# Patient Record
Sex: Female | Born: 1970 | Race: White | Hispanic: No | Marital: Married | Smoking: Never smoker
Health system: Southern US, Community
[De-identification: ages and names within clinical notes are randomized; demographics above are authoritative.]

## PROBLEM LIST (undated history)

## (undated) DIAGNOSIS — I10 Essential (primary) hypertension: Secondary | ICD-10-CM

## (undated) HISTORY — PX: ABDOMINAL HYSTERECTOMY: SHX81

---

## 2004-12-19 ENCOUNTER — Ambulatory Visit: Payer: Self-pay | Admitting: Gastroenterology

## 2004-12-20 ENCOUNTER — Ambulatory Visit: Payer: Self-pay | Admitting: Surgery

## 2005-01-17 ENCOUNTER — Ambulatory Visit: Payer: Self-pay | Admitting: Family Medicine

## 2005-03-01 ENCOUNTER — Inpatient Hospital Stay: Payer: Self-pay | Admitting: Vascular Surgery

## 2005-09-14 ENCOUNTER — Ambulatory Visit: Payer: Self-pay | Admitting: Family Medicine

## 2005-10-16 ENCOUNTER — Ambulatory Visit: Payer: Self-pay | Admitting: Family Medicine

## 2005-12-17 ENCOUNTER — Ambulatory Visit: Payer: Self-pay | Admitting: Family Medicine

## 2006-04-08 ENCOUNTER — Ambulatory Visit: Payer: Self-pay | Admitting: Family Medicine

## 2006-08-15 ENCOUNTER — Emergency Department: Payer: Self-pay | Admitting: Emergency Medicine

## 2007-01-29 ENCOUNTER — Ambulatory Visit: Payer: Self-pay | Admitting: Family Medicine

## 2007-07-16 ENCOUNTER — Encounter (INDEPENDENT_AMBULATORY_CARE_PROVIDER_SITE_OTHER): Payer: Self-pay | Admitting: Internal Medicine

## 2007-07-17 ENCOUNTER — Ambulatory Visit: Payer: Self-pay | Admitting: Family Medicine

## 2007-07-17 DIAGNOSIS — R1031 Right lower quadrant pain: Secondary | ICD-10-CM | POA: Insufficient documentation

## 2008-01-09 ENCOUNTER — Ambulatory Visit: Payer: Self-pay | Admitting: Family Medicine

## 2008-01-09 DIAGNOSIS — R209 Unspecified disturbances of skin sensation: Secondary | ICD-10-CM | POA: Insufficient documentation

## 2008-01-09 DIAGNOSIS — G500 Trigeminal neuralgia: Secondary | ICD-10-CM | POA: Insufficient documentation

## 2008-01-15 ENCOUNTER — Telehealth (INDEPENDENT_AMBULATORY_CARE_PROVIDER_SITE_OTHER): Payer: Self-pay | Admitting: Internal Medicine

## 2008-01-18 ENCOUNTER — Ambulatory Visit: Payer: Self-pay | Admitting: Internal Medicine

## 2008-01-18 ENCOUNTER — Inpatient Hospital Stay (HOSPITAL_COMMUNITY): Admission: EM | Admit: 2008-01-18 | Discharge: 2008-01-19 | Payer: Self-pay | Admitting: Emergency Medicine

## 2008-01-19 ENCOUNTER — Encounter: Payer: Self-pay | Admitting: Family Medicine

## 2008-01-19 ENCOUNTER — Ambulatory Visit: Payer: Self-pay | Admitting: *Deleted

## 2008-01-19 ENCOUNTER — Encounter (INDEPENDENT_AMBULATORY_CARE_PROVIDER_SITE_OTHER): Payer: Self-pay | Admitting: Internal Medicine

## 2008-01-19 ENCOUNTER — Encounter: Payer: Self-pay | Admitting: Internal Medicine

## 2008-02-03 ENCOUNTER — Ambulatory Visit: Payer: Self-pay | Admitting: Family Medicine

## 2008-02-03 DIAGNOSIS — E786 Lipoprotein deficiency: Secondary | ICD-10-CM | POA: Insufficient documentation

## 2008-02-03 DIAGNOSIS — G459 Transient cerebral ischemic attack, unspecified: Secondary | ICD-10-CM | POA: Insufficient documentation

## 2008-02-10 ENCOUNTER — Ambulatory Visit: Payer: Self-pay | Admitting: Family Medicine

## 2008-02-10 DIAGNOSIS — D6489 Other specified anemias: Secondary | ICD-10-CM | POA: Insufficient documentation

## 2008-02-23 LAB — CONVERTED CEMR LAB
Eosinophils Relative: 1.7 % (ref 0.0–5.0)
HCT: 30.9 % — ABNORMAL LOW (ref 36.0–46.0)
Neutrophils Relative %: 48.1 % (ref 43.0–77.0)
Platelets: 243 10*3/uL (ref 150–400)
RBC: 4.58 M/uL (ref 3.87–5.11)
RDW: 17 % — ABNORMAL HIGH (ref 11.5–14.6)
Saturation Ratios: 11.8 % — ABNORMAL LOW (ref 20.0–50.0)
Transferrin: 358.4 mg/dL (ref >=212.0)
WBC: 6.2 10*3/uL (ref 4.5–10.5)

## 2008-03-01 ENCOUNTER — Encounter (INDEPENDENT_AMBULATORY_CARE_PROVIDER_SITE_OTHER): Payer: Self-pay | Admitting: *Deleted

## 2008-03-05 ENCOUNTER — Telehealth: Payer: Self-pay | Admitting: Family Medicine

## 2008-03-08 ENCOUNTER — Ambulatory Visit: Payer: Self-pay | Admitting: Family Medicine

## 2008-04-01 ENCOUNTER — Encounter: Payer: Self-pay | Admitting: Family Medicine

## 2008-04-05 ENCOUNTER — Ambulatory Visit: Payer: Self-pay | Admitting: Family Medicine

## 2008-04-05 ENCOUNTER — Telehealth (INDEPENDENT_AMBULATORY_CARE_PROVIDER_SITE_OTHER): Payer: Self-pay | Admitting: Internal Medicine

## 2008-04-06 ENCOUNTER — Telehealth (INDEPENDENT_AMBULATORY_CARE_PROVIDER_SITE_OTHER): Payer: Self-pay | Admitting: Internal Medicine

## 2008-04-09 ENCOUNTER — Encounter (INDEPENDENT_AMBULATORY_CARE_PROVIDER_SITE_OTHER): Payer: Self-pay | Admitting: Internal Medicine

## 2008-04-09 LAB — CONVERTED CEMR LAB
AST: 21 units/L (ref 0–37)
Albumin: 3.5 g/dL (ref 3.5–5.2)
BUN: 4 mg/dL — ABNORMAL LOW (ref 6–23)
Basophils Absolute: 0 10*3/uL (ref 0.0–0.1)
Basophils Relative: 0.1 % (ref 0.0–1.0)
Calcium: 8.6 mg/dL (ref 8.4–10.5)
Chloride: 105 meq/L (ref 96–112)
Cholesterol: 120 mg/dL (ref 0–200)
Creatinine, Ser: 0.8 mg/dL (ref 0.4–1.2)
Eosinophils Absolute: 0.1 10*3/uL (ref 0.0–0.7)
Eosinophils Relative: 1.6 % (ref 0.0–5.0)
Ferritin: 1.7 ng/mL — ABNORMAL LOW (ref 10.0–291.0)
Folate: 6.1 ng/mL
GFR calc Af Amer: 103 mL/min
GFR calc non Af Amer: 85 mL/min
HCT: 25.5 % — ABNORMAL LOW (ref 36.0–46.0)
HDL: 23.8 mg/dL — ABNORMAL LOW (ref >39.0)
Iron: 13 ug/dL — ABNORMAL LOW (ref 42–145)
MCHC: 30.8 g/dL (ref 30.0–36.0)
MCV: 69.4 fL — ABNORMAL LOW (ref 78.0–100.0)
Monocytes Absolute: 0.7 10*3/uL (ref 0.1–1.0)
Neutrophils Relative %: 55 % (ref 43.0–77.0)
RBC: 3.67 M/uL — ABNORMAL LOW (ref 3.87–5.11)
TSH: 2.17 microintl units/mL (ref 0.35–5.50)
Total Bilirubin: 0.4 mg/dL (ref 0.3–1.2)
Transferrin: 334.3 mg/dL (ref >=212.0)
VLDL: 24 mg/dL (ref 0–40)

## 2010-01-16 ENCOUNTER — Ambulatory Visit: Payer: Self-pay | Admitting: Unknown Physician Specialty

## 2010-01-27 ENCOUNTER — Ambulatory Visit: Payer: Self-pay | Admitting: Unknown Physician Specialty

## 2010-02-16 ENCOUNTER — Ambulatory Visit: Payer: Self-pay | Admitting: Unknown Physician Specialty

## 2010-03-16 ENCOUNTER — Ambulatory Visit: Payer: Self-pay | Admitting: Unknown Physician Specialty

## 2010-07-27 ENCOUNTER — Ambulatory Visit: Payer: Self-pay | Admitting: Unknown Physician Specialty

## 2011-04-17 NOTE — Discharge Summary (Signed)
NAME:  Becky Harris, Becky Harris                  ACCOUNT NO.:  [id number]   MEDICAL RECORD NO.:  [id number]          PATIENT TYPE:  INP   LOCATION:  3039                         FACILITY:  [facility]   PHYSICIAN:  Valerie A. Felicity Coyer, MDDATE OF BIRTH:  1971/01/15   DATE OF ADMISSION:  01/18/2008  DATE OF DISCHARGE:  01/19/2008                               DISCHARGE SUMMARY   DISCHARGE DIAGNOSES:  1. Transient ischemic attack.  2. 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   HISTORY OF PRESENT ILLNESS:  1. Becky Harris is a 40 year old female who was admitted on January 18, 2008, with a chief complaint of numbness in her face, arms, and      legs.  She has no significant past medical history and had been in      good health prior to the past 1 week.  On January 09, 2008, the      patient noted an episode of numbness and tingling in her right face      and right forearm.  She denied any pain, weakness, or slurred      speech associated with this episode.  The episode lasted      approximately 12 hours.  2. Trigeminal nerve neuralgia.  Because her symptoms subsided, no      further workup was initiated.  She did have another episode where      she fell lightheaded on January 15, 2008.  She was admitted for      further evaluation and treatment.   PAST MEDICAL HISTORY:  None.   COURSE OF HOSPITALIZATION:  TIA:  The patient was admitted and underwent  CT of the head, which showed no acute changes.  This was followed by an  MRA, which showed no acute CVA.  A 2-D echo was performed during this  admission, which showed normal LV function.  She was noted to have 40%  to 60% right carotid stenosis and an aspirin was added during this  admission.  This will need outpatient monitoring.   MEDICATIONS AT THE TIME OF DISCHARGE:  Aspirin 325 mg p.o. daily.   FOLLOWUP:  The patient is to follow up with Dr. Sherryll Burger in approximately 2  weeks and contact the office for an appointment.   PERTINENT LABORATORIES AT THE  TIME OF DISCHARGE:  Hemoglobin 9.9,  hematocrit 31.2, BUN 3, creatinine 0.8, cholesterol 116, and LDL 72.     Sandford Craze, NP      Raenette Rover. Felicity Coyer, MD  Electronically Signed   MO/MEDQ  D:  02/19/2008  T:  02/20/2008  Job:  [id number]

## 2011-04-17 NOTE — H&P (Signed)
NAMEDORIANN, ZUCH NO.:  [id number]   MEDICAL RECORD NO.:  [id number]          PATIENT TYPE:  EMS   LOCATION:  MAJO                         FACILITY:  [facility]   PHYSICIAN:  Therisa Doyne, MD    DATE OF BIRTH:  [birth year]   DATE OF ADMISSION:  01/18/2008  DATE OF DISCHARGE:                              HISTORY & PHYSICAL   PRIMARY CARE Crosley Stejskal:  Arta Silence, M.D.   CHIEF COMPLAINT:  Numbness in the face, arms, and legs.   HISTORY OF PRESENT ILLNESS:  A 40 year old white female with no  significant past medical history who presents for evaluation of numbness  and tingling over the past one week.  The patient has been in good  health prior to the past one week.  On January 09, 2008, the patient  reports an episode of numbness and tingling in her right face and right  forearm.  She denies any pain, weakness or slurred speech associated  with this episode.  The episode lasted approximately 12 hours.  She was  seen and evaluated by her primary care physician who felt this was  possibly trigeminal neuralgia.  Because her symptoms subsided, no  further workup was initiated.  However, on January 15, 2008, she had  another episode where she felt lightheaded.  Also, her entire face felt  numb and tingling.  Additionally, she had associated lip and tongue  tingling, and her right forearm was again numb.  She denies any focal  weakness or slurred speech during this episode.  This lasted  approximately 3-4 hours and resolved.   Today, she represents with similar complaints but also has a sharp pain  on the right side of her head.  Additionally her right leg has  associated numbness.  She denies any focal weakness, slurred speech,  vision changes, bowel or bladder incontinence, or recent infections.  She has never had any symptoms like this before.   REVIEW OF SYSTEMS:  All systems reviewed and are negative except as  mentioned above in the history of present  illness.   PAST MEDICAL HISTORY:  None.   SOCIAL HISTORY:  The patient lives in [place].  She denies tobacco,  alcohol, or drugs.   FAMILY HISTORY:  There is a family history for cerebrovascular accident  in her father at the age of 23.   ALLERGIES:  No known drug allergies.   MEDICATIONS:  None.   PHYSICAL EXAMINATION:  VITAL SIGNS:  Temperature is 96.9, blood pressure  141/91, pulse 68, respirations 20, oxygen saturation 100% on room air.  GENERAL:  No acute distress.  HEENT:  Normocephalic atraumatic.  Pupils equal, round, reactive to  light and accommodation.  Extraocular movements are intact.  NECK:  Supple.  No lymphadenopathy.  No jugular venous distention.  No  masses.  CARDIOVASCULAR:  Regular rate and rhythm.  No murmurs, rubs, or gallops.  CHEST:  Clear to auscultation bilaterally.  ABDOMEN:  Soft, nontender, nondistended.  EXTREMITIES:  No clubbing, cyanosis, or edema.  NEUROLOGIC:  In general she is alert and oriented x3.  Cranial nerves II-  XII are grossly intact with no focal deficits.  Deep tendon reflexes are  2 plus bilaterally.  Muscle strength is 5/5 in the bilateral upper and  lower extremities.  Sensation is globally intact.  On cerebellar  function testing her rapid alternating movements are intact but she does  have mild dysdiadochokinesis of the left hand with finger-to-nose.  This  improved on her second try with increased concentration.   LABORATORY:  CBC and BMP within normal limits.   STUDIES:  1. CT of the head showed no acute intracranial abnormalities.  2. EKG showed a normal sinus rhythm at 52 beats per minute with no      acute ST-T wave changes.   ASSESSMENT:  A 40 year old white female with no significant past medical  history who presents for evaluation of multiple episodes of facial  numbness, right forearm tingling, and right leg numbness and tingling  for the past one week.   PLAN:  1. We will admit the patient to [facility].   1. Episodes of numbness and tingling.  These are worrisome for      transient ischemic attacks.  However, the differential also      includes multiple sclerosis versus trigeminal neuralgia versus      vasculitis.  Her only risk factor for stroke is a family history.      She denies hypertension, hyperlipidemia, and diabetes, although she      is hypertensive here.  As I am concerned about transient ischemic      attacks, we will start her on aspirin 325 mg daily.  We will      proceed with further workup with an MRI of the brain as well as an      MRA of the brain, carotid Dopplers, transcranial Dopplers, and a      transthoracic echocardiogram a bubble study.  We will check a      fasting lipid profile, a hemoglobin A1c, TSH, B12, and folate.  Her      EKG is negative for atrial fibrillation, so these events are      unlikely to be cardioembolic in nature and therefore      anticoagulation is not indicated.  It may be prudent to consult      neurology in the morning.   1. Fluids, electrolytes, nutrition.  Saline lock IV fluids, regular      diet.   1. DVT prophylaxis with Lovenox.      Therisa Doyne, MD  Electronically Signed     SJT/MEDQ  D:  01/18/2008  T:  01/19/2008  Job:  [id number]

## 2011-08-24 LAB — DIFFERENTIAL
Basophils Absolute: 0.2 — ABNORMAL HIGH
Eosinophils Absolute: 0.1
Lymphocytes Relative: 35
Monocytes Relative: 8
Neutro Abs: 5.8
Neutrophils Relative %: 54

## 2011-08-24 LAB — I-STAT 8, (EC8 V) (CONVERTED LAB)
Acid-base deficit: 1
Bicarbonate: 24.1 — ABNORMAL HIGH
Chloride: 106
HCT: 35 — ABNORMAL LOW
Operator id: 285491
pCO2, Ven: 42.4 — ABNORMAL LOW
pH, Ven: 7.363 — ABNORMAL HIGH

## 2011-08-24 LAB — LIPID PANEL
Cholesterol: 116
HDL: 24 — ABNORMAL LOW
LDL Cholesterol: 72
Total CHOL/HDL Ratio: 4.8
VLDL: 20

## 2011-08-24 LAB — CBC
MCHC: 31.7
Platelets: 276
RDW: 19.3 — ABNORMAL HIGH

## 2011-08-24 LAB — POCT I-STAT CREATININE: Creatinine, Ser: 0.8

## 2013-04-14 ENCOUNTER — Emergency Department: Payer: Self-pay | Admitting: Emergency Medicine

## 2013-05-22 ENCOUNTER — Emergency Department: Payer: Self-pay | Admitting: Emergency Medicine

## 2013-05-22 LAB — URINALYSIS, COMPLETE
Ketone: NEGATIVE
Ph: 7 (ref 4.5–8.0)
Specific Gravity: 1.006 (ref 1.003–1.030)
WBC UR: <1 /HPF (ref 0–5)

## 2013-05-22 LAB — COMPREHENSIVE METABOLIC PANEL
Albumin: 4 g/dL (ref 3.4–5.0)
Alkaline Phosphatase: 70 U/L (ref 50–136)
Anion Gap: 5 — ABNORMAL LOW (ref 7–16)
Bilirubin,Total: 0.3 mg/dL (ref 0.2–1.0)
Creatinine: 0.85 mg/dL (ref 0.60–1.30)
EGFR (Non-African Amer.): >60
Glucose: 115 mg/dL — ABNORMAL HIGH (ref 65–99)
Osmolality: 280 (ref 275–301)
SGOT(AST): 25 U/L (ref 15–37)
Sodium: 140 mmol/L (ref 136–145)
Total Protein: 7.4 g/dL (ref 6.4–8.2)

## 2013-05-22 LAB — CBC: MCV: 85 fL (ref 80–100)

## 2016-05-23 ENCOUNTER — Ambulatory Visit: Payer: Self-pay | Attending: Internal Medicine

## 2018-08-22 ENCOUNTER — Emergency Department: Payer: No Typology Code available for payment source

## 2018-08-22 ENCOUNTER — Encounter: Payer: Self-pay | Admitting: Emergency Medicine

## 2018-08-22 ENCOUNTER — Other Ambulatory Visit: Payer: Self-pay

## 2018-08-22 ENCOUNTER — Emergency Department
Admission: EM | Admit: 2018-08-22 | Discharge: 2018-08-22 | Disposition: A | Discharge status: Home / Self Care | Payer: No Typology Code available for payment source | Attending: Student in an Organized Health Care Education/Training Program | Admitting: Student in an Organized Health Care Education/Training Program

## 2018-08-22 DIAGNOSIS — Z79899 Other long term (current) drug therapy: Secondary | ICD-10-CM | POA: Insufficient documentation

## 2018-08-22 DIAGNOSIS — Y9389 Activity, other specified: Secondary | ICD-10-CM | POA: Diagnosis not present

## 2018-08-22 DIAGNOSIS — Y92410 Unspecified street and highway as the place of occurrence of the external cause: Secondary | ICD-10-CM | POA: Diagnosis not present

## 2018-08-22 DIAGNOSIS — S161XXA Strain of muscle, fascia and tendon at neck level, initial encounter: Secondary | ICD-10-CM | POA: Insufficient documentation

## 2018-08-22 DIAGNOSIS — I10 Essential (primary) hypertension: Secondary | ICD-10-CM | POA: Insufficient documentation

## 2018-08-22 DIAGNOSIS — S199XXA Unspecified injury of neck, initial encounter: Secondary | ICD-10-CM | POA: Diagnosis present

## 2018-08-22 DIAGNOSIS — S6000XA Contusion of unspecified finger without damage to nail, initial encounter: Secondary | ICD-10-CM | POA: Diagnosis not present

## 2018-08-22 DIAGNOSIS — W2210XA Striking against or struck by unspecified automobile airbag, initial encounter: Secondary | ICD-10-CM | POA: Insufficient documentation

## 2018-08-22 DIAGNOSIS — Y998 Other external cause status: Secondary | ICD-10-CM | POA: Insufficient documentation

## 2018-08-22 HISTORY — DX: Essential (primary) hypertension: I10

## 2018-08-22 MED ORDER — CYCLOBENZAPRINE HCL 10 MG PO TABS
10.0000 mg | ORAL_TABLET | Freq: Once | ORAL | Status: AC
  Administered 2018-08-22: 10 mg via ORAL
  Filled 2018-08-22: qty 1

## 2018-08-22 MED ORDER — NAPROXEN 500 MG PO TABS: 500.0000 mg | ORAL_TABLET | Freq: Two times a day (BID) | ORAL | 0 refills | Status: DC

## 2018-08-22 MED ORDER — CYCLOBENZAPRINE HCL 10 MG PO TABS: 10.0000 mg | ORAL_TABLET | Freq: Three times a day (TID) | ORAL | 0 refills | Status: DC | PRN

## 2018-08-22 MED ORDER — NAPROXEN 500 MG PO TABS
500.0000 mg | ORAL_TABLET | Freq: Once | ORAL | Status: AC
  Administered 2018-08-22: 500 mg via ORAL
  Filled 2018-08-22: qty 1

## 2018-08-22 NOTE — Discharge Instructions (Signed)
Follow up with your primary care provider for symptoms that are not improving over the next week or so. °Return to the ER for symptoms that change or worsen if unable to schedule an appointment. °
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
## 2018-12-16 ENCOUNTER — Other Ambulatory Visit: Payer: Self-pay | Admitting: Orthopedic Surgery

## 2018-12-16 DIAGNOSIS — M545 Low back pain, unspecified: Secondary | ICD-10-CM

## 2018-12-21 ENCOUNTER — Ambulatory Visit
Admission: RE | Admit: 2018-12-21 | Discharge: 2018-12-21 | Disposition: A | Discharge status: Home / Self Care | Payer: BLUE CROSS/BLUE SHIELD | Source: Ambulatory Visit | Attending: Orthopedic Surgery | Admitting: Orthopedic Surgery

## 2018-12-21 DIAGNOSIS — M545 Low back pain, unspecified: Secondary | ICD-10-CM

## 2019-01-09 ENCOUNTER — Other Ambulatory Visit: Payer: Self-pay | Admitting: Internal Medicine

## 2019-01-09 DIAGNOSIS — Z1231 Encounter for screening mammogram for malignant neoplasm of breast: Secondary | ICD-10-CM

## 2019-01-21 ENCOUNTER — Other Ambulatory Visit: Payer: Self-pay | Admitting: Student

## 2019-01-21 DIAGNOSIS — M5416 Radiculopathy, lumbar region: Secondary | ICD-10-CM

## 2019-01-27 ENCOUNTER — Other Ambulatory Visit: Payer: BLUE CROSS/BLUE SHIELD

## 2019-02-02 ENCOUNTER — Other Ambulatory Visit: Payer: Self-pay | Admitting: Student

## 2019-02-02 ENCOUNTER — Ambulatory Visit
Admission: RE | Admit: 2019-02-02 | Discharge: 2019-02-02 | Disposition: A | Discharge status: Home / Self Care | Payer: BLUE CROSS/BLUE SHIELD | Source: Ambulatory Visit | Attending: Student | Admitting: Student

## 2019-02-02 DIAGNOSIS — M5416 Radiculopathy, lumbar region: Secondary | ICD-10-CM

## 2019-02-02 MED ORDER — IOPAMIDOL (ISOVUE-M 200) INJECTION 41%
1.0000 mL | Freq: Once | INTRAMUSCULAR | Status: AC
  Administered 2019-02-02: 1 mL via EPIDURAL

## 2019-02-02 MED ORDER — METHYLPREDNISOLONE ACETATE 40 MG/ML INJ SUSP (RADIOLOG
120.0000 mg | Freq: Once | INTRAMUSCULAR | Status: AC
  Administered 2019-02-02: 120 mg via EPIDURAL

## 2019-02-02 NOTE — Discharge Instructions (Signed)

## 2019-02-12 ENCOUNTER — Other Ambulatory Visit: Payer: Self-pay

## 2019-02-12 ENCOUNTER — Ambulatory Visit
Admission: RE | Admit: 2019-02-12 | Discharge: 2019-02-12 | Disposition: A | Discharge status: Home / Self Care | Payer: BLUE CROSS/BLUE SHIELD | Source: Ambulatory Visit | Attending: Internal Medicine | Admitting: Internal Medicine

## 2019-02-12 DIAGNOSIS — Z1231 Encounter for screening mammogram for malignant neoplasm of breast: Secondary | ICD-10-CM | POA: Diagnosis not present

## 2020-06-22 ENCOUNTER — Other Ambulatory Visit: Payer: Self-pay | Admitting: Internal Medicine

## 2020-06-22 DIAGNOSIS — Z1231 Encounter for screening mammogram for malignant neoplasm of breast: Secondary | ICD-10-CM

## 2020-10-19 ENCOUNTER — Other Ambulatory Visit: Payer: Self-pay

## 2020-10-19 ENCOUNTER — Ambulatory Visit
Admission: RE | Admit: 2020-10-19 | Discharge: 2020-10-19 | Disposition: A | Discharge status: Home / Self Care | Payer: BLUE CROSS/BLUE SHIELD | Source: Ambulatory Visit | Attending: Internal Medicine | Admitting: Internal Medicine

## 2020-10-19 DIAGNOSIS — Z1231 Encounter for screening mammogram for malignant neoplasm of breast: Secondary | ICD-10-CM | POA: Diagnosis not present

## 2021-03-26 IMAGING — MG DIGITAL SCREENING BILAT W/ TOMO W/ CAD
8 series · 8 of 24 positions shown · non-contrast
Comparison: Previous exam(s).

CLINICAL DATA: Screening.

EXAM:
DIGITAL SCREENING BILATERAL MAMMOGRAM WITH TOMO AND CAD

[R CC synth-2D]
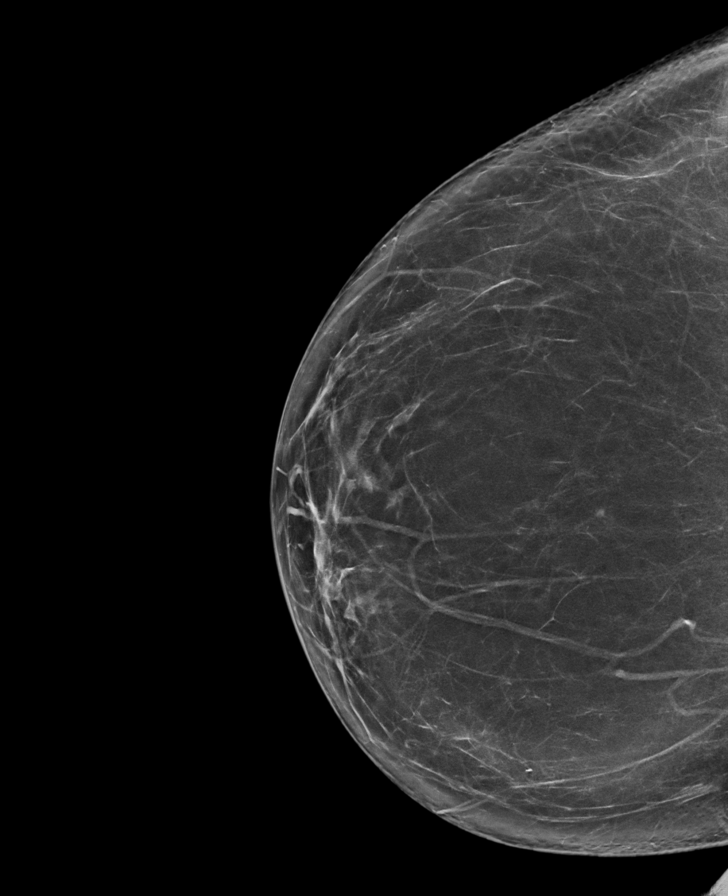

[L CC synth-2D]
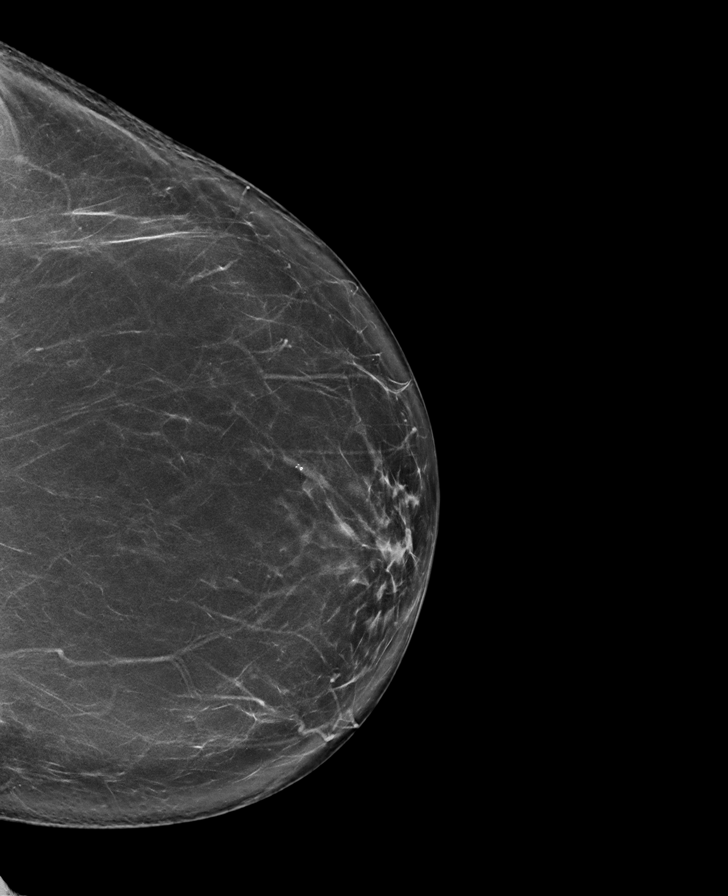

[L MLO synth-2D]
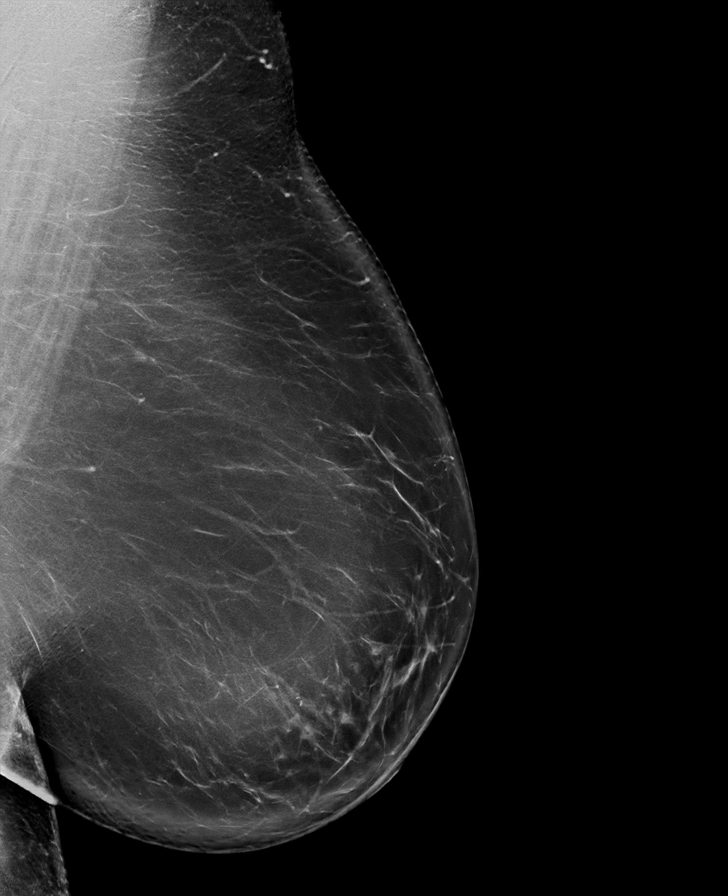

[R MLO synth-2D]
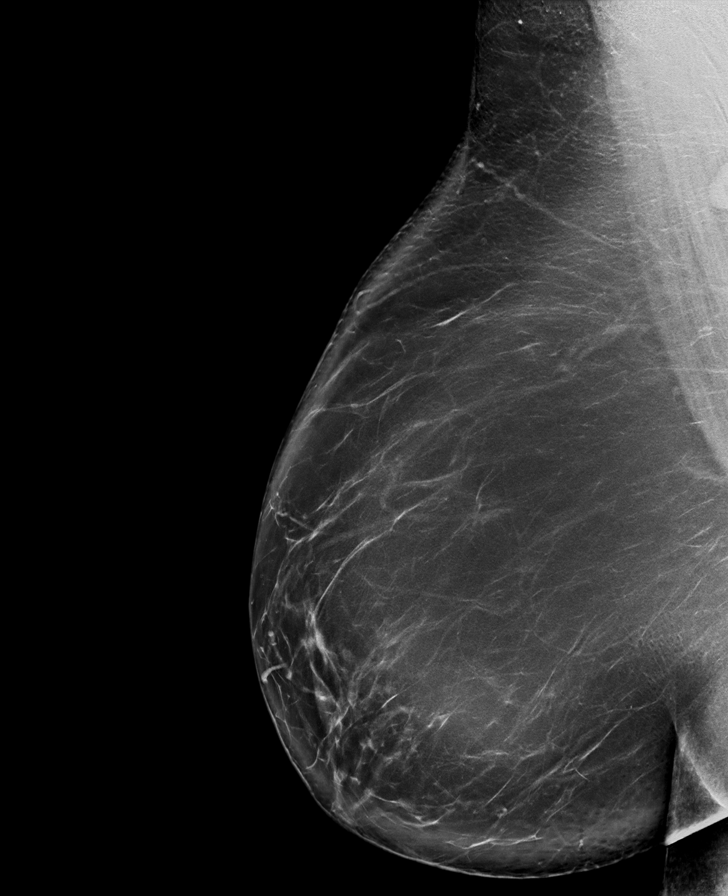

[L MLO tomo · tomo slice 53/106.0]
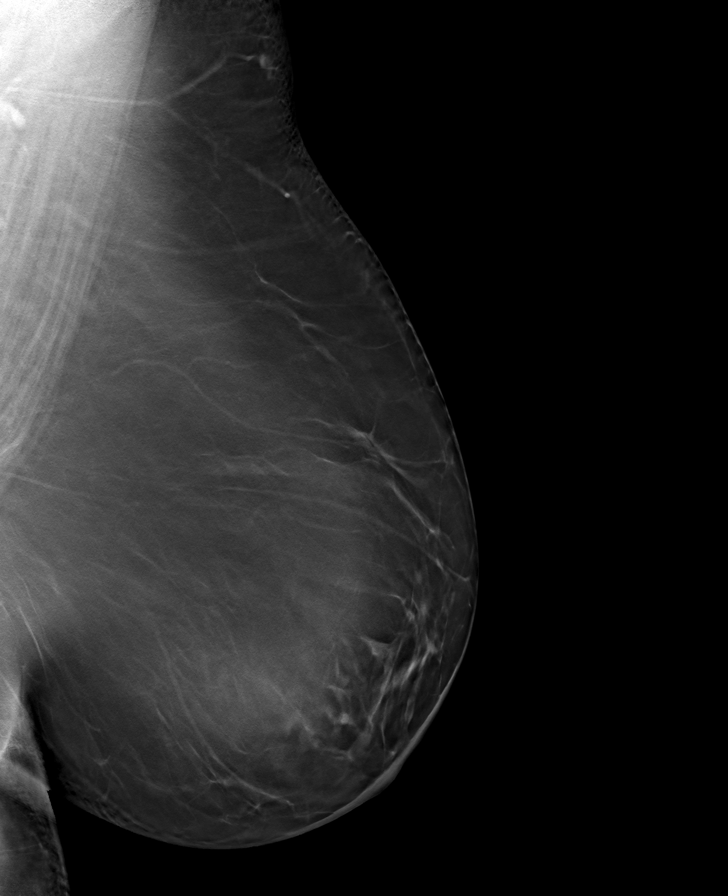

[L CC tomo · tomo slice 43/85.0]
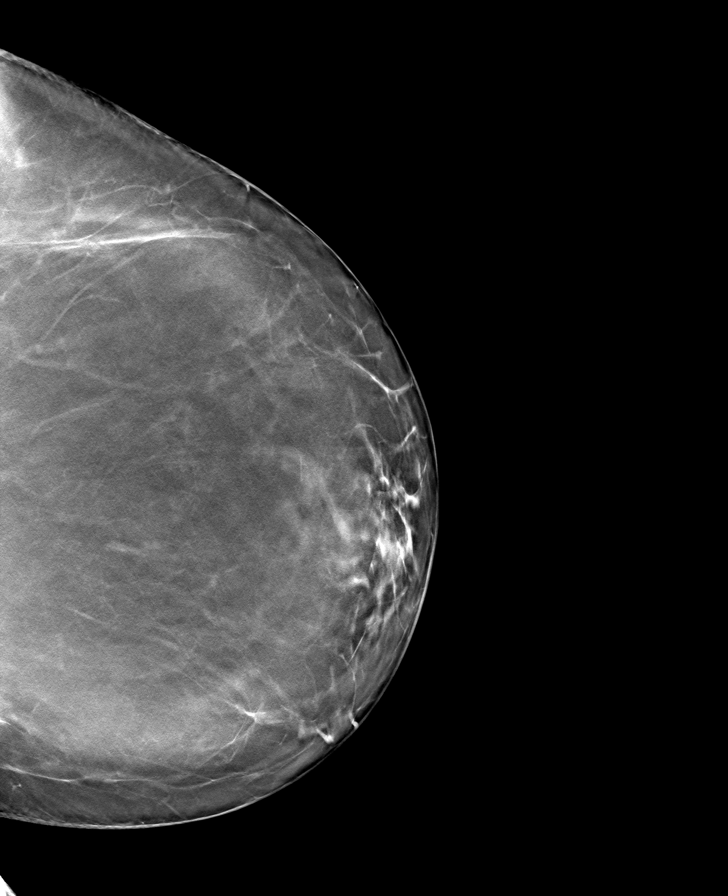

[R MLO tomo · tomo slice 55/108.0]
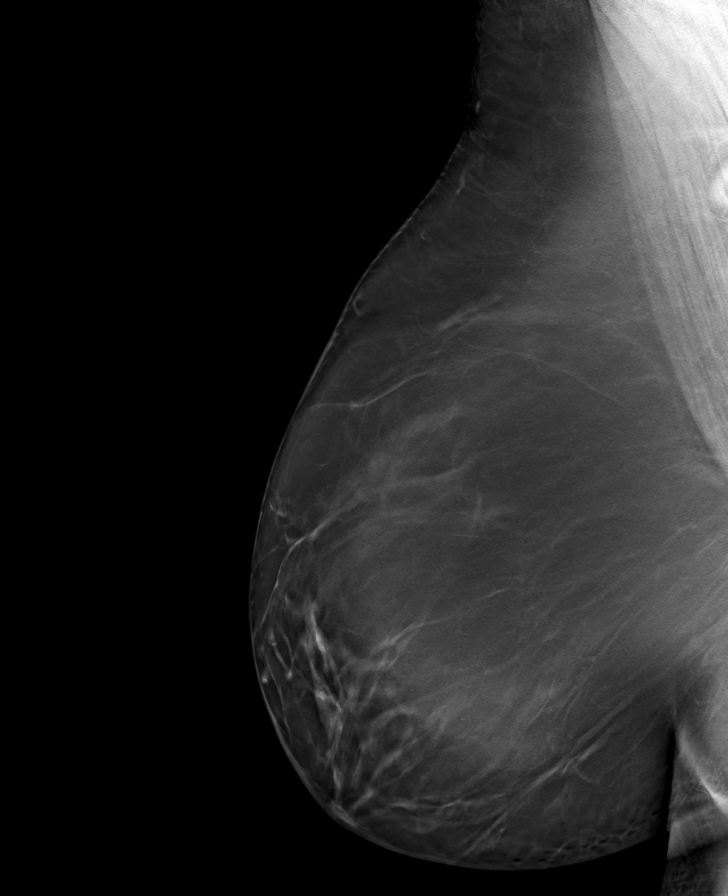

[R CC tomo · tomo slice 41/81.0]
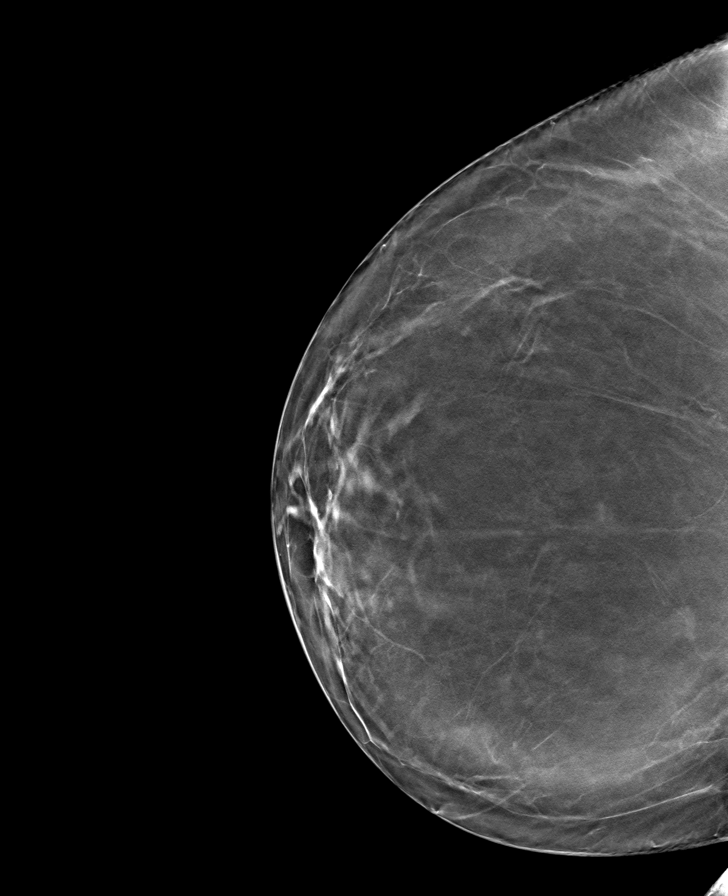

[8 of 24 positions shown; findings below may reference images not displayed]

ACR Breast Density Category b: There are scattered areas of
fibroglandular density.
FINDINGS: There are no findings suspicious for malignancy. Images were
processed with CAD.
IMPRESSION: No mammographic evidence of malignancy. A result letter of this
screening mammogram will be mailed directly to the patient.

RECOMMENDATION:
Screening mammogram in one year. (Code:CN-U-775)

BI-RADS CATEGORY  1: Negative.

## 2021-03-27 ENCOUNTER — Ambulatory Visit: Payer: Self-pay | Admitting: Podiatry

## 2021-05-02 ENCOUNTER — Other Ambulatory Visit: Payer: Self-pay

## 2021-05-02 ENCOUNTER — Ambulatory Visit
Admission: EM | Admit: 2021-05-02 | Discharge: 2021-05-02 | Disposition: A | Discharge status: Home / Self Care | Payer: BC Managed Care – PPO | Attending: Emergency Medicine | Admitting: Emergency Medicine

## 2021-05-02 DIAGNOSIS — J029 Acute pharyngitis, unspecified: Secondary | ICD-10-CM | POA: Insufficient documentation

## 2021-05-02 LAB — GROUP A STREP BY PCR: Group A Strep by PCR: NOT DETECTED

## 2021-05-02 MED ORDER — LIDOCAINE VISCOUS HCL 2 % MT SOLN: 15.0000 mL | OROMUCOSAL | 0 refills | Status: AC | PRN

## 2021-05-02 NOTE — Discharge Instructions (Addendum)
Use the viscous lidocaine, 15 mL every 2 hours, as needed for sore throat pain.  Return for reevaluation, or follow-up with your primary care provider for any continued or worsening symptoms.

## 2021-05-02 NOTE — ED Provider Notes (Addendum)
[facility]    CSN: [id number] Arrival date & time: 05/02/21  1933      History   Chief Complaint Chief Complaint  Patient presents with  . Sore Throat    HPI Becky Harris is a 50 y.o. female.   HPI   50 year old female here for evaluation of right sided sore throat.  Patient reports that she developed a sore throat this morning its only on the right side that hurts when she swallows or when she moves her neck but not necessarily at rest.  She has not had a fever, runny nose or nasal congestion, cough, or ear pain.  Patient denies eating anything sharp or potentially swelling any chicken or fish bones which may have caused injury.  Past Medical History:  Diagnosis Date  . Hypertension     Patient Active Problem List   Diagnosis Date Noted  . OTHER SPECIFIED ANEMIAS 02/10/2008  . LOW HDL 02/03/2008  . TIA 02/03/2008  . NEURALGIA, TRIGEMINAL 01/09/2008  . NUMBNESS, ARM 01/09/2008  . RLQ PAIN 07/17/2007    Past Surgical History:  Procedure Laterality Date  . ABDOMINAL HYSTERECTOMY      OB History   No obstetric history on file.      Home Medications    Prior to Admission medications   Medication Sig Start Date End Date Taking? Authorizing Provider  amLODipine (NORVASC) 5 MG tablet Take 1 tablet by mouth daily. 03/18/21  Yes [provider]  bisoprolol-hydrochlorothiazide (ZIAC) 5-6.25 MG tablet Take 1 tablet by mouth daily. 04/17/19 04/11/22 Yes [provider]  hydroxychloroquine (PLAQUENIL) 200 MG tablet Take 200 mg by mouth daily.   Yes [provider]  leflunomide (ARAVA) 10 MG tablet Take 10 mg by mouth daily. 04/27/21  Yes [provider]  lidocaine (XYLOCAINE) 2 % solution Use as directed 15 mLs in the mouth or throat as needed for mouth pain. 05/02/21  Yes Becky Augusta, NP  meclizine (ANTIVERT) 25 MG tablet Take 25 mg by mouth 3 (three) times daily. 12/27/20  Yes [provider]  topiramate (TOPAMAX)  50 MG tablet Take 1 tablet by mouth 2 (two) times daily. 06/22/20  Yes [provider]    Family History Family History  Problem Relation Age of Onset  . Breast cancer Mother 28    Social History Social History   Tobacco Use  . Smoking status: Never Smoker  . Smokeless tobacco: Never Used  Vaping Use  . Vaping Use: Never used  Substance Use Topics  . Alcohol use: Not Currently     Allergies   Morphine and related   Review of Systems Review of Systems  Constitutional: Negative for activity change, appetite change and fever.  HENT: Positive for sore throat. Negative for congestion, ear pain and rhinorrhea.   Respiratory: Negative for cough and shortness of breath.   Hematological: Negative.   Psychiatric/Behavioral: Negative.      Physical Exam Triage Vital Signs ED Triage Vitals  Enc Vitals Group     BP 05/02/21 1948 (!) 136/98     Pulse Rate 05/02/21 1948 62     Resp 05/02/21 1948 18     Temp 05/02/21 1948 98.2 F (36.8 C)     Temp Source 05/02/21 1948 Oral     SpO2 05/02/21 1948 97 %     Weight 05/02/21 1945 232 lb (105.2 kg)     Height 05/02/21 1945 5\' 3"  (1.6 m)     Head Circumference --  Peak Flow --      Pain Score 05/02/21 1944 8     Pain Loc --      Pain Edu? --      Excl. in GC? --    No data found.  Updated Vital Signs BP (!) 136/98 (BP Location: Left Arm)   Pulse 62   Temp 98.2 F (36.8 C) (Oral)   Resp 18   Ht 5\' 3"  (1.6 m)   Wt 232 lb (105.2 kg)   SpO2 97%   BMI 41.10 kg/m   Visual Acuity Right Eye Distance:   Left Eye Distance:   Bilateral Distance:    Right Eye Near:   Left Eye Near:    Bilateral Near:     Physical Exam Vitals and nursing note reviewed.  Constitutional:      General: She is not in acute distress.    Appearance: Normal appearance. She is not ill-appearing.  HENT:     Head: Normocephalic and atraumatic.     Right Ear: Tympanic membrane, ear canal and external ear normal. There is no impacted  cerumen.     Left Ear: Tympanic membrane, ear canal and external ear normal. There is no impacted cerumen.     Mouth/Throat:     Mouth: Mucous membranes are moist.     Pharynx: Oropharynx is clear. No oropharyngeal exudate or posterior oropharyngeal erythema.  Cardiovascular:     Rate and Rhythm: Normal rate and regular rhythm.     Pulses: Normal pulses.     Heart sounds: Normal heart sounds. No murmur heard. No gallop.   Pulmonary:     Effort: Pulmonary effort is normal.     Breath sounds: Normal breath sounds. No wheezing, rhonchi or rales.  Musculoskeletal:     Cervical back: Normal range of motion and neck supple. Tenderness present.  Lymphadenopathy:     Cervical: No cervical adenopathy.  Skin:    General: Skin is warm and dry.     Capillary Refill: Capillary refill takes less than 2 seconds.     Findings: No erythema.  Neurological:     General: No focal deficit present.     Mental Status: She is alert and oriented to person, place, and time.  Psychiatric:        Mood and Affect: Mood normal.        Behavior: Behavior normal.        Thought Content: Thought content normal.        Judgment: Judgment normal.      UC Treatments / Results  Labs (all labs ordered are listed, but only abnormal results are displayed) Labs Reviewed  GROUP A STREP BY PCR    EKG   Radiology No results found.  Procedures Procedures (including critical care time)  Medications Ordered in UC Medications - No data to display  Initial Impression / Assessment and Plan / UC Course  I have reviewed the triage vital signs and the nursing notes.  Pertinent labs & imaging results that were available during my care of the patient were reviewed by me and considered in my medical decision making (see chart for details).   Patient is a very pleasant, nontoxic-appearing 50 year old female here for evaluation of right-sided sore throat pain that started this morning when she woke up.  She denies  any upper respiratory symptoms in conjunction with this or fever.  She states that it only hurts when she swallows or when she moves her neck from side to side  or up and down.  Physical exam reveals bilateral pearly gray tympanic membranes with normal light reflex and clear external auditory canals.  Oropharyngeal exam is unremarkable.  There is no tonsillar edema, erythema, or exudate.  No posterior oropharyngeal erythema or exudate noted.  No cervical lymphadenopathy appreciated exam.  Patient does have mild soft tissue tenderness without fullness, induration, or fluctuance on the right side of her larynx.  Cardiopulmonary dam is benign.  Unsure of patient's etiology of her sore throat pain.  Strep PCR collected at triage. I offered to send a throat culture but she reports that she would follow-up with her primary care doctor for that.  I offered her viscous lidocaine to help with her symptoms and she agreed.  Patient advised to take 15 mL of viscous lidocaine and with every 2 hours but as needed for her sore throat pain to see if helps with her symptoms.  Strep PCR is negative.  Final Clinical Impressions(s) / UC Diagnoses   Final diagnoses:  Pharyngitis, unspecified etiology   Discharge Instructions   None    ED Prescriptions    Medication Sig Dispense Auth. Provider   lidocaine (XYLOCAINE) 2 % solution Use as directed 15 mLs in the mouth or throat as needed for mouth pain. 100 mL Becky Augusta, NP     PDMP not reviewed this encounter.   Becky Augusta, NP 05/02/21 2030    Becky Augusta, NP 05/02/21 2031

## 2021-05-02 NOTE — ED Triage Notes (Signed)
Pt c/o right-sided sore throat since this morning. Pt also reports pain with movement of her neck. Pt is concerned about strep throat, states it feels similar to previous episodes. Pt denies f/n/v/d, nasal congestion, cough or other symptoms.

## 2022-02-02 ENCOUNTER — Other Ambulatory Visit: Payer: Self-pay | Admitting: Unknown Physician Specialty

## 2022-02-02 DIAGNOSIS — T17908A Unspecified foreign body in respiratory tract, part unspecified causing other injury, initial encounter: Secondary | ICD-10-CM

## 2022-02-12 ENCOUNTER — Ambulatory Visit: Admission: RE | Admit: 2022-02-12 | Payer: BC Managed Care – PPO | Source: Ambulatory Visit

## 2022-04-11 ENCOUNTER — Other Ambulatory Visit: Payer: Self-pay | Admitting: Internal Medicine

## 2022-04-11 DIAGNOSIS — Z1231 Encounter for screening mammogram for malignant neoplasm of breast: Secondary | ICD-10-CM

## 2022-05-15 ENCOUNTER — Ambulatory Visit
Admission: RE | Admit: 2022-05-15 | Discharge: 2022-05-15 | Disposition: A | Discharge status: Home / Self Care | Payer: BC Managed Care – PPO | Source: Ambulatory Visit | Attending: Internal Medicine | Admitting: Internal Medicine

## 2022-05-15 DIAGNOSIS — Z1231 Encounter for screening mammogram for malignant neoplasm of breast: Secondary | ICD-10-CM | POA: Diagnosis present

## 2022-08-22 ENCOUNTER — Other Ambulatory Visit: Payer: Self-pay | Admitting: Internal Medicine

## 2022-08-22 DIAGNOSIS — R221 Localized swelling, mass and lump, neck: Secondary | ICD-10-CM

## 2022-08-23 ENCOUNTER — Ambulatory Visit
Admission: RE | Admit: 2022-08-23 | Discharge: 2022-08-23 | Disposition: A | Discharge status: Home / Self Care | Payer: BC Managed Care – PPO | Source: Ambulatory Visit | Attending: Internal Medicine | Admitting: Internal Medicine

## 2022-08-23 DIAGNOSIS — R221 Localized swelling, mass and lump, neck: Secondary | ICD-10-CM

## 2022-08-23 MED ORDER — IOPAMIDOL (ISOVUE-300) INJECTION 61%
75.0000 mL | Freq: Once | INTRAVENOUS | Status: AC | PRN
  Administered 2022-08-23: 75 mL via INTRAVENOUS

## 2022-08-24 ENCOUNTER — Other Ambulatory Visit: Payer: Self-pay | Admitting: Internal Medicine

## 2022-08-24 DIAGNOSIS — R221 Localized swelling, mass and lump, neck: Secondary | ICD-10-CM

## 2022-08-27 ENCOUNTER — Ambulatory Visit
Admission: RE | Admit: 2022-08-27 | Discharge: 2022-08-27 | Disposition: A | Discharge status: Home / Self Care | Payer: BC Managed Care – PPO | Source: Ambulatory Visit | Attending: Internal Medicine | Admitting: Internal Medicine

## 2022-08-27 DIAGNOSIS — R221 Localized swelling, mass and lump, neck: Secondary | ICD-10-CM | POA: Diagnosis present

## 2022-08-27 LAB — POCT I-STAT CREATININE: Creatinine, Ser: 0.7 mg/dL (ref 0.44–1.00)

## 2022-08-27 MED ORDER — IOHEXOL 300 MG/ML  SOLN
75.0000 mL | Freq: Once | INTRAMUSCULAR | Status: AC | PRN
  Administered 2022-08-27: 75 mL via INTRAVENOUS

## 2023-04-10 ENCOUNTER — Other Ambulatory Visit: Payer: Self-pay | Admitting: Internal Medicine

## 2023-04-10 DIAGNOSIS — Z1231 Encounter for screening mammogram for malignant neoplasm of breast: Secondary | ICD-10-CM

## 2024-01-24 ENCOUNTER — Other Ambulatory Visit: Payer: Self-pay | Admitting: Internal Medicine

## 2024-01-24 DIAGNOSIS — Z1231 Encounter for screening mammogram for malignant neoplasm of breast: Secondary | ICD-10-CM

## 2024-06-24 ENCOUNTER — Ambulatory Visit
Admission: RE | Admit: 2024-06-24 | Discharge: 2024-06-24 | Disposition: A | Discharge status: Home / Self Care | Source: Ambulatory Visit | Attending: Internal Medicine | Admitting: Internal Medicine

## 2024-06-24 DIAGNOSIS — Z1231 Encounter for screening mammogram for malignant neoplasm of breast: Secondary | ICD-10-CM | POA: Diagnosis present
